# Patient Record
Sex: Female | Born: 2000 | Race: White | Hispanic: No | Marital: Single | Smoking: Never smoker
Health system: Southern US, Community
[De-identification: ages and names within clinical notes are randomized; demographics above are authoritative.]

## PROBLEM LIST (undated history)

## (undated) DIAGNOSIS — J189 Pneumonia, unspecified organism: Secondary | ICD-10-CM

## (undated) HISTORY — PX: NO PAST SURGERIES: SHX2092

---
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
## 2022-01-07 ENCOUNTER — Encounter (HOSPITAL_COMMUNITY): Payer: Self-pay | Admitting: Radiology

## 2022-02-11 ENCOUNTER — Telehealth: Payer: Self-pay | Admitting: Gastroenterology

## 2022-02-11 DIAGNOSIS — R1013 Epigastric pain: Secondary | ICD-10-CM

## 2022-02-11 NOTE — Telephone Encounter (Signed)
Patient left vm stating that she needs to make an appt with [place] for an ultrasound. States that she was seen 6 months ago and was told to schedule another ultrasound.  ?

## 2022-02-13 ENCOUNTER — Telehealth: Payer: Self-pay

## 2022-02-13 NOTE — Telephone Encounter (Signed)
This is a Dr Maximino Greenland pt seen 10/22 due for 6 month [place].... Please advise if okay to order under your name or would you like pt to have an OV with you first? ?

## 2022-02-13 NOTE — Telephone Encounter (Signed)
error 

## 2022-02-17 ENCOUNTER — Telehealth: Payer: Self-pay | Admitting: Gastroenterology

## 2022-02-17 NOTE — Telephone Encounter (Signed)
Left message on voicemail ? ?[place] scheduled at [facility] 4/17 arrive at 8:00am, NPO after 12 midnight ?

## 2022-02-17 NOTE — Telephone Encounter (Signed)
Patient left vm returning a call. Requesting a call back. ?

## 2022-02-17 NOTE — Addendum Note (Signed)
Addended by: Roena Malady on: 02/17/2022 09:53 AM ? ? Modules accepted: Orders ? ?

## 2022-02-19 NOTE — Telephone Encounter (Signed)
Pt is aware of US.

## 2022-02-24 ENCOUNTER — Ambulatory Visit
Admission: RE | Admit: 2022-02-24 | Discharge: 2022-02-24 | Disposition: A | Discharge status: Home / Self Care | Payer: Managed Care, Other (non HMO) | Source: Ambulatory Visit | Attending: Gastroenterology | Admitting: Gastroenterology

## 2022-02-24 DIAGNOSIS — R1013 Epigastric pain: Secondary | ICD-10-CM

## 2022-07-05 IMAGING — US US ABDOMEN LIMITED
1 series · 14 of 25 positions shown · non-contrast
Comparison: none available

CLINICAL DATA: Epigastric pain, liver lesion

EXAM:
ULTRASOUND ABDOMEN LIMITED RIGHT UPPER QUADRANT

[Series 1: us abdomen limited · 0.15mm/px · 61 acquisitions, 14 frames shown]
[im 1/61]
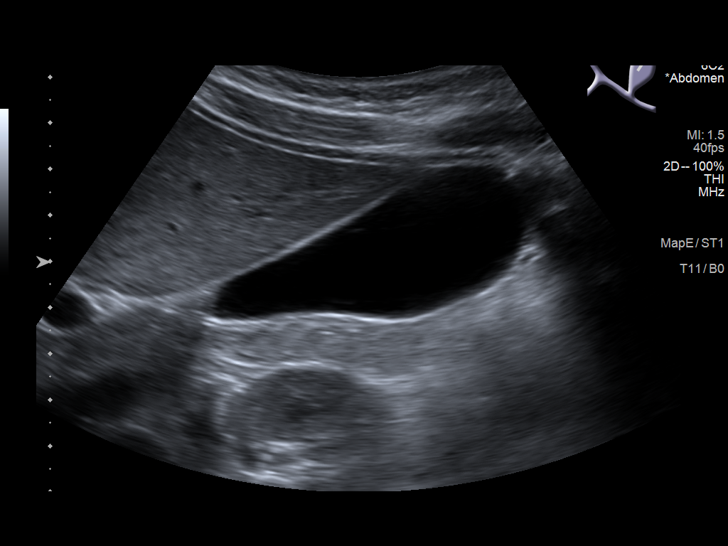
[im 6/61]
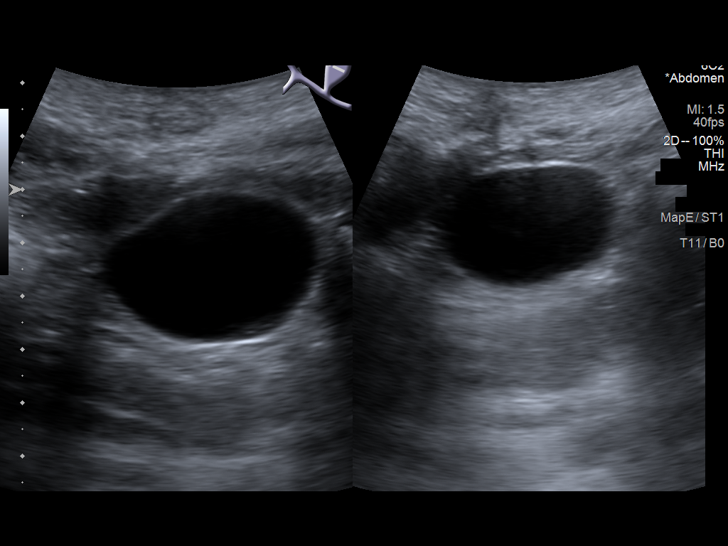
[im 11/61]
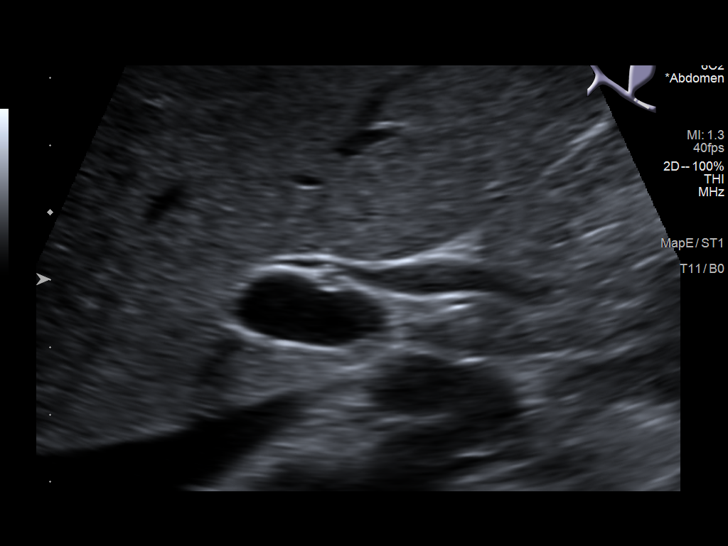
[im 16/61]
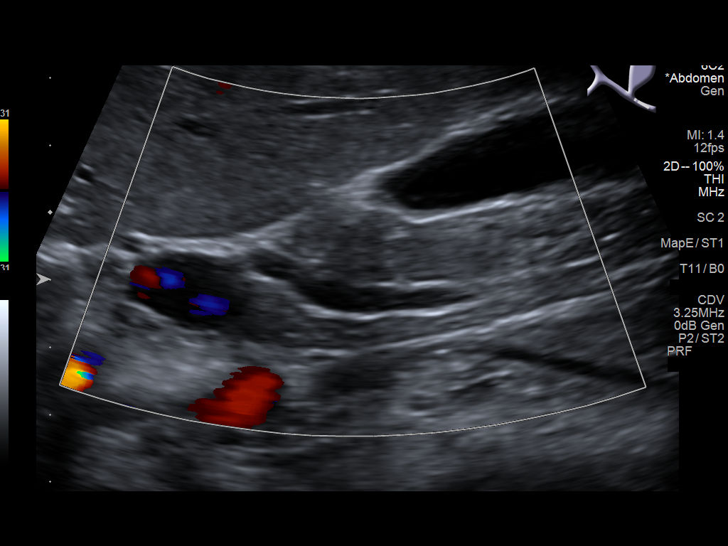
[im 21/61]
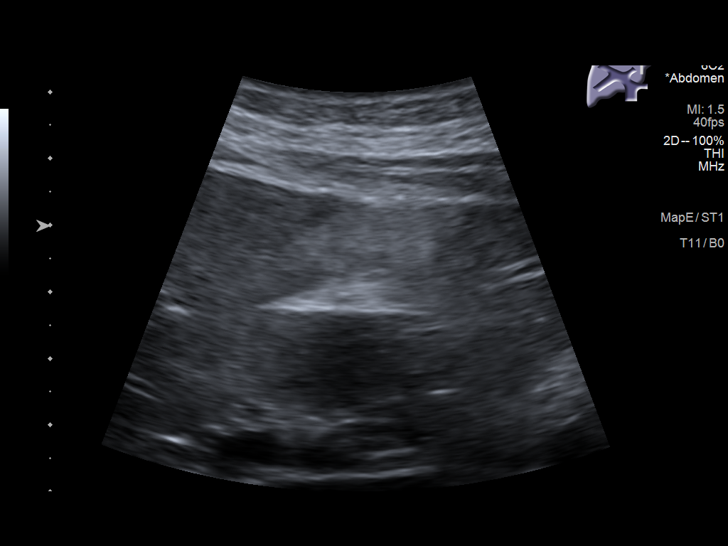
[im 23/61]
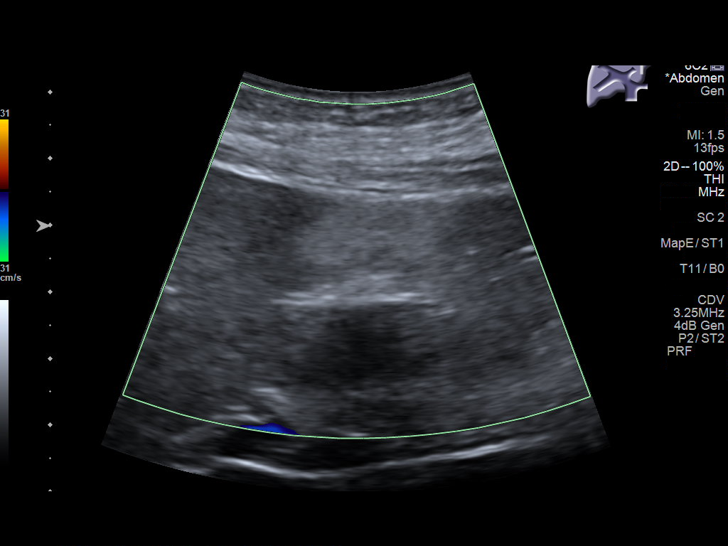
[im 28/61]
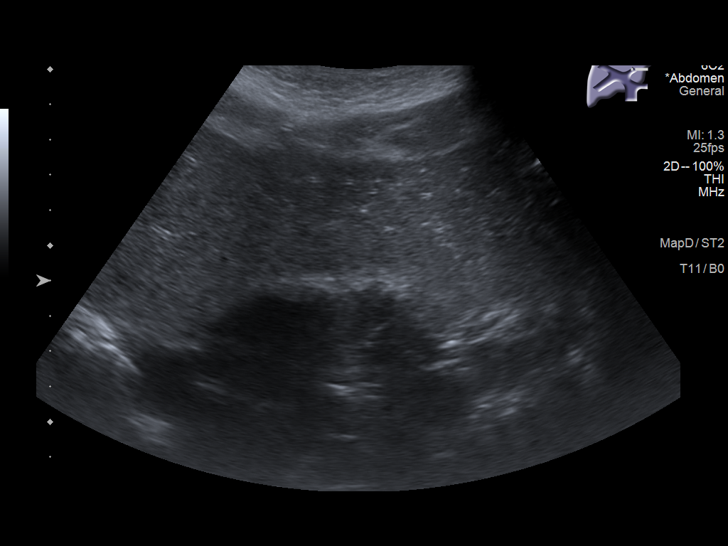
[im 33/61]
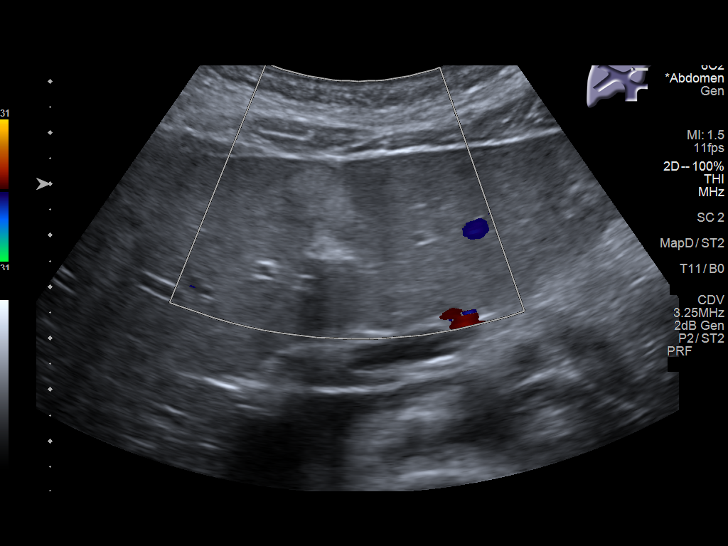
[im 38/61]
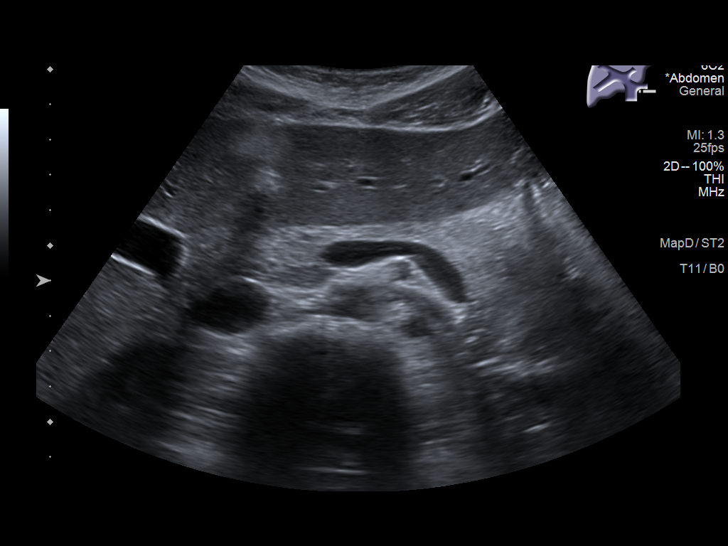
[im 41/61]
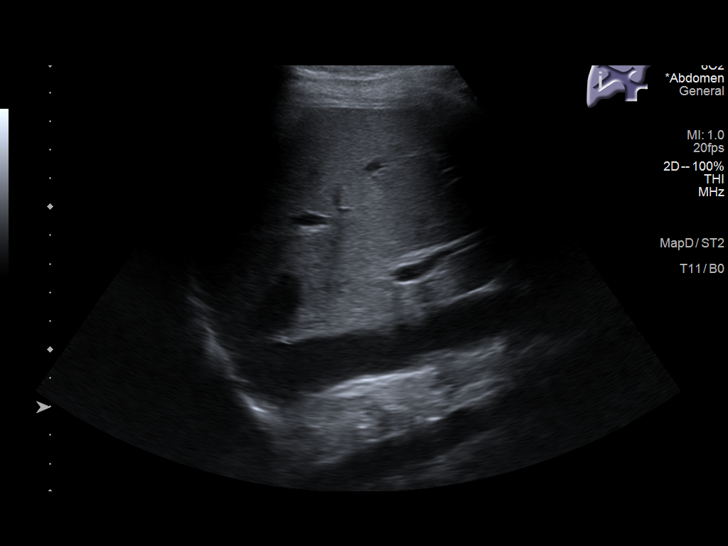
[im 46/61]
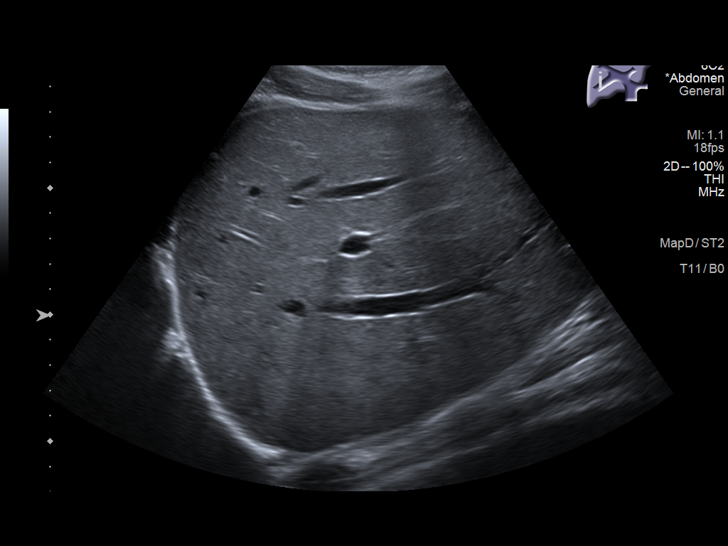
[im 51/61]
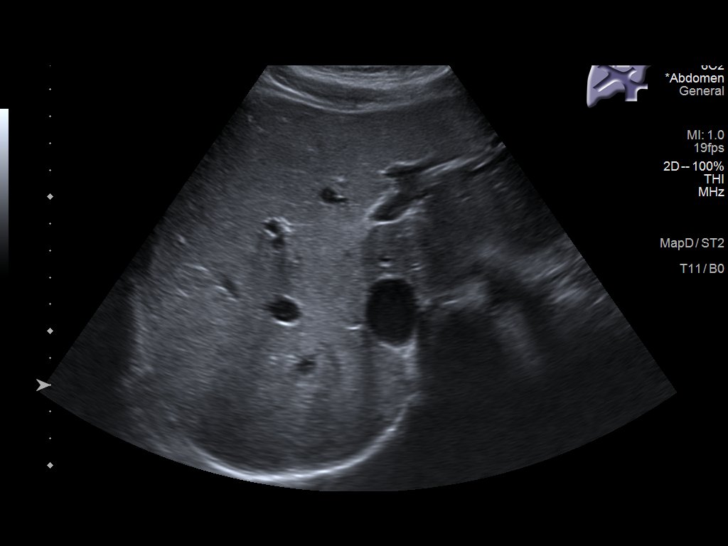
[im 56/61]
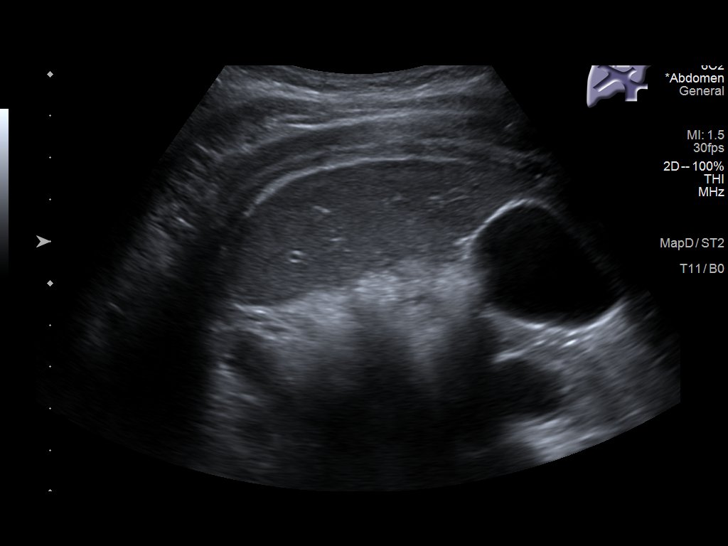
[im 61/61]
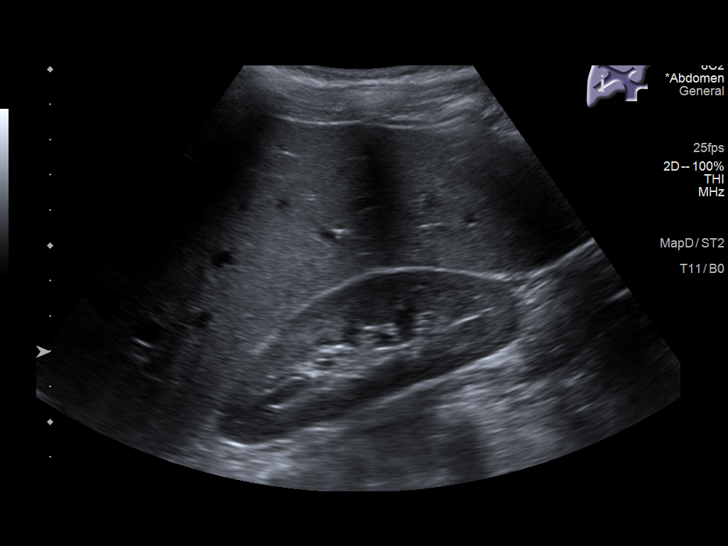

[14 of 25 positions shown; findings below may reference images not displayed]

FINDINGS: Gallbladder:

No gallstones or wall thickening visualized. No sonographic Murphy
sign noted by sonographer.

Common bile duct:

Diameter: 2 mm

Liver:

Normal background echogenicity. Within the anterior left hepatic
lobe, there is a well-circumscribed slightly lobulated hyperechoic
subcapsular lesion measuring 2.2 x 1.0 x 1.4 cm, favored to be a
hemangioma.

No other hepatic abnormality. No biliary dilatation or obstruction
pattern. Portal vein is patent on color Doppler imaging with normal
direction of blood flow towards the liver.

Other: No free fluid. Included views of the right kidney demonstrate
no hydronephrosis.
IMPRESSION: 2.2 cm anterior left hepatic subcapsular hyperechoic lesion
compatible with hemangioma. No available comparison studies to
document stability. Recommend follow-up abdominal ultrasound in 6
months.

No other acute finding by ultrasound.

## 2022-07-19 ENCOUNTER — Encounter: Payer: Self-pay | Admitting: Emergency Medicine

## 2022-07-19 ENCOUNTER — Other Ambulatory Visit: Payer: Self-pay

## 2022-07-19 ENCOUNTER — Emergency Department
Admission: EM | Admit: 2022-07-19 | Discharge: 2022-07-19 | Disposition: A | Discharge status: Home / Self Care | Payer: Managed Care, Other (non HMO) | Attending: Emergency Medicine | Admitting: Emergency Medicine

## 2022-07-19 ENCOUNTER — Emergency Department: Payer: Managed Care, Other (non HMO)

## 2022-07-19 DIAGNOSIS — A09 Infectious gastroenteritis and colitis, unspecified: Secondary | ICD-10-CM

## 2022-07-19 DIAGNOSIS — R197 Diarrhea, unspecified: Secondary | ICD-10-CM | POA: Diagnosis not present

## 2022-07-19 DIAGNOSIS — R112 Nausea with vomiting, unspecified: Secondary | ICD-10-CM | POA: Insufficient documentation

## 2022-07-19 LAB — URINE DRUG SCREEN, QUALITATIVE (ARMC ONLY)
Amphetamines, Ur Screen: NOT DETECTED
Barbiturates, Ur Screen: NOT DETECTED
Benzodiazepine, Ur Scrn: NOT DETECTED
Cannabinoid 50 Ng, Ur ~~LOC~~: POSITIVE — AB
Cocaine Metabolite,Ur ~~LOC~~: NOT DETECTED
MDMA (Ecstasy)Ur Screen: NOT DETECTED
Methadone Scn, Ur: NOT DETECTED
Opiate, Ur Screen: NOT DETECTED
Phencyclidine (PCP) Ur S: NOT DETECTED
Tricyclic, Ur Screen: NOT DETECTED

## 2022-07-19 LAB — URINALYSIS, ROUTINE W REFLEX MICROSCOPIC
Bilirubin Urine: NEGATIVE
Glucose, UA: NEGATIVE mg/dL
Hgb urine dipstick: NEGATIVE
Ketones, ur: 5 mg/dL — AB
Leukocytes,Ua: NEGATIVE
Nitrite: NEGATIVE
Protein, ur: 30 mg/dL — AB
Specific Gravity, Urine: 1.02 (ref 1.005–1.030)
pH: 6 (ref 5.0–8.0)

## 2022-07-19 LAB — COMPREHENSIVE METABOLIC PANEL
ALT: 42 U/L (ref 0–44)
AST: 54 U/L — ABNORMAL HIGH (ref 15–41)
Albumin: 4.8 g/dL (ref 3.5–5.0)
Alkaline Phosphatase: 66 U/L (ref 38–126)
Anion gap: 14 (ref 5–15)
BUN: 9 mg/dL (ref 6–20)
CO2: 20 mmol/L — ABNORMAL LOW (ref 22–32)
Calcium: 9.6 mg/dL (ref 8.9–10.3)
Chloride: 108 mmol/L (ref 98–111)
Creatinine, Ser: 0.82 mg/dL (ref 0.44–1.00)
GFR, Estimated: >60 mL/min (ref >60)
Glucose, Bld: 135 mg/dL — ABNORMAL HIGH (ref 70–99)
Potassium: 4.2 mmol/L (ref 3.5–5.1)
Sodium: 142 mmol/L (ref 135–145)
Total Bilirubin: 0.9 mg/dL (ref 0.3–1.2)
Total Protein: 8.1 g/dL (ref 6.5–8.1)

## 2022-07-19 LAB — CBC
HCT: 38.6 % (ref 36.0–46.0)
Hemoglobin: 12.9 g/dL (ref 12.0–15.0)
MCH: 30.6 pg (ref 26.0–34.0)
MCHC: 33.4 g/dL (ref 30.0–36.0)
MCV: 91.7 fL (ref 80.0–100.0)
Platelets: 251 10*3/uL (ref 150–400)
RBC: 4.21 MIL/uL (ref 3.87–5.11)
RDW: 12.1 % (ref 11.5–15.5)
WBC: 14.8 10*3/uL — ABNORMAL HIGH (ref 4.0–10.5)
nRBC: 0 % (ref 0.0–0.2)

## 2022-07-19 LAB — PREGNANCY, URINE: Preg Test, Ur: NEGATIVE

## 2022-07-19 LAB — CBG MONITORING, ED: Glucose-Capillary: 101 mg/dL — ABNORMAL HIGH (ref 70–99)

## 2022-07-19 LAB — LIPASE, BLOOD: Lipase: 24 U/L (ref 11–51)

## 2022-07-19 MED ORDER — LORAZEPAM 2 MG/ML IJ SOLN
1.0000 mg | Freq: Once | INTRAMUSCULAR | Status: AC
  Administered 2022-07-19: 1 mg via INTRAVENOUS
  Filled 2022-07-19: qty 1

## 2022-07-19 MED ORDER — ONDANSETRON 4 MG PO TBDP: 4.0000 mg | ORAL_TABLET | Freq: Four times a day (QID) | ORAL | 0 refills | Status: AC | PRN

## 2022-07-19 MED ORDER — IOHEXOL 300 MG/ML  SOLN
100.0000 mL | Freq: Once | INTRAMUSCULAR | Status: AC | PRN
  Administered 2022-07-19: 100 mL via INTRAVENOUS

## 2022-07-19 MED ORDER — AZITHROMYCIN 500 MG PO TABS: 500.0000 mg | ORAL_TABLET | Freq: Every day | ORAL | 0 refills | Status: AC

## 2022-07-19 MED ORDER — SODIUM CHLORIDE 0.9 % IV BOLUS
1000.0000 mL | Freq: Once | INTRAVENOUS | Status: AC
  Administered 2022-07-19: 1000 mL via INTRAVENOUS

## 2022-07-19 MED ORDER — ONDANSETRON HCL 4 MG/2ML IJ SOLN
4.0000 mg | INTRAMUSCULAR | Status: AC
  Administered 2022-07-19: 4 mg via INTRAVENOUS
  Filled 2022-07-19: qty 2

## 2022-07-19 MED ORDER — AZITHROMYCIN 500 MG PO TABS
500.0000 mg | ORAL_TABLET | Freq: Once | ORAL | Status: AC
  Administered 2022-07-19: 500 mg via ORAL
  Filled 2022-07-19: qty 1

## 2022-07-19 MED ORDER — ACETAMINOPHEN 500 MG PO TABS
1000.0000 mg | ORAL_TABLET | ORAL | Status: AC
  Administered 2022-07-19: 1000 mg via ORAL
  Filled 2022-07-19: qty 2

## 2022-07-19 NOTE — ED Triage Notes (Signed)
Pt reports for the past year she has had intermittent NVD. Pt states she woke up this am with another episode of it. Pt reports has been following a GI specialist but no one has been able to give her answers. Pt wants relief from sx's and to know the cause.

## 2022-07-19 NOTE — ED Notes (Signed)
Pt presents to ED with c/o of ABD pain, N/V/D for the past few years. Pt states she is here to be treated "and get answers". Pt does endorse she smoked marijuana daily and does not plan to stop to see if this helps her situation.

## 2022-07-19 NOTE — ED Notes (Signed)
Pt was drinking water and was educated to remain NPO due to N/V and needing a CT scan.   Pt also given paper scrub pants due to not making it to the restroom in time, belongings bag given to pt as well.

## 2022-07-19 NOTE — ED Provider Notes (Signed)
[facility] Provider Note    Event Date/Time   First MD Initiated Contact with Patient 07/19/22 [phone]     (approximate)   History   Emesis, Nausea, and Diarrhea   HPI  Hannah Parks is a 21 y.o. female who on review of gastroenterology notes from July of last year has a history of intermittent abdominal pain.  Notation from GI at that point advised symptoms at that point were felt likely functional.  Has trialed PPI in the past.  Had reassuring evaluation including inflammatory markers and Helicobacter testing as well as celiac serology in July  Patient reports last night she started developing upper abdominal pain nausea loose stools.  She reports she gets crampy pain followed by vomiting up yellow sort of frothy emesis and then will have very loose watery stools.  This happens about once every couple of months.  Seems to come out of the blue she does not know why.  She has been seen by specialist before had an ultrasound and blood work with no result.  Denies bloody stool.      Physical Exam   Triage Vital Signs: ED Triage Vitals  Enc Vitals Group     BP 07/19/22 0839 (!) 138/108     Pulse Rate 07/19/22 0839 100     Resp 07/19/22 0839 20     Temp 07/19/22 0839 (!) 97.5 F (36.4 C)     Temp Source 07/19/22 0839 Oral     SpO2 07/19/22 0839 96 %     Weight 07/19/22 0838 103 lb (46.7 kg)     Height 07/19/22 0838 5\' 3"  (1.6 m)     Head Circumference --      Peak Flow --      Pain Score 07/19/22 0838 8     Pain Loc --      Pain Edu? --      Excl. in GC? --     Most recent vital signs: Vitals:   07/19/22 1336 07/19/22 1444  BP: (!) 110/57 106/68  Pulse: 69 78  Resp: 14 15  Temp: (!) 100.9 F (38.3 C)   SpO2: 95% 100%     General: Awake, no distress.  She is however pacing about the room, somewhat elevated in her speech, and appears very anxious. CV:  Good peripheral perfusion.  No tachycardia.  Warm well-perfused normal  tones Resp:  Normal effort.  Clear bilateral. Abd:  No distention.  Abdomen soft, mild tenderness lower and left lower quadrant without rebound or guarding.  Examination had to be performed after patient was given initial treatment as she was initially pacing about an quite uncomfortable. Other:  Nontoxic in appearance.   ED Results / Procedures / Treatments   Labs (all labs ordered are listed, but only abnormal results are displayed) Labs Reviewed  COMPREHENSIVE METABOLIC PANEL - Abnormal; Notable for the following components:      Result Value   CO2 20 (*)    Glucose, Bld 135 (*)    AST 54 (*)    All other components within normal limits  CBC - Abnormal; Notable for the following components:   WBC 14.8 (*)    All other components within normal limits  URINALYSIS, ROUTINE W REFLEX MICROSCOPIC - Abnormal; Notable for the following components:   Color, Urine YELLOW (*)    APPearance HAZY (*)    Ketones, ur 5 (*)    Protein, ur 30 (*)    Bacteria, UA RARE (*)  All other components within normal limits  URINE DRUG SCREEN, QUALITATIVE ([facility] ONLY) - Abnormal; Notable for the following components:   Cannabinoid 50 Ng, Ur [place] POSITIVE (*)    All other components within normal limits  CBG MONITORING, ED - Abnormal; Notable for the following components:   Glucose-Capillary 101 (*)    All other components within normal limits  GASTROINTESTINAL PANEL BY PCR, STOOL (REPLACES STOOL CULTURE)  C DIFFICILE QUICK SCREEN W PCR REFLEX    URINE CULTURE  LIPASE, BLOOD  PREGNANCY, URINE  POC URINE PREG, ED    RADIOLOGY  CT ABDOMEN PELVIS W CONTRAST  Result Date: 07/19/2022 CLINICAL DATA:  Abdominal pain, acute, nonlocalized. EXAM: CT ABDOMEN AND PELVIS WITH CONTRAST TECHNIQUE: Multidetector CT imaging of the abdomen and pelvis was performed using the standard protocol following bolus administration of intravenous contrast. RADIATION DOSE REDUCTION: This exam was performed according to the  departmental dose-optimization program which includes automated exposure control, adjustment of the mA and/or kV according to patient size and/or use of iterative reconstruction technique. CONTRAST:  OMNIPAQUE IOHEXOL 300 MG/ML  SOLN COMPARISON:  None Available. FINDINGS: Lower chest: No acute abnormality. Hepatobiliary: No focal liver abnormality is seen. Gallbladder appears normal. No bile duct dilatation. Pancreas: Unremarkable. No pancreatic ductal dilatation or surrounding inflammatory changes. Spleen: Normal in size without focal abnormality. Adrenals/Urinary Tract: Adrenal glands appear normal. Kidneys are unremarkable without mass, stone or hydronephrosis. No perinephric fluid. No ureteral or bladder calculi are identified. Bladder appears normal. Stomach/Bowel: The majority of the walls of the large bowel appear thickened/edematous. Fluid is seen throughout a significant portion of the small bowel, with additional areas of small bowel wall thickening/inflammation. Appendix appears grossly normal. Stomach is unremarkable. Vascular/Lymphatic: No vascular abnormality seen. No enlarged lymph nodes are seen in the abdomen or pelvis. Reproductive: Uterus and bilateral adnexa are unremarkable. Other: No substantial free fluid. No abscess collection or free intraperitoneal air. Musculoskeletal: No osseous abnormality. IMPRESSION: 1. Findings are consistent with a diffuse enterocolitis of infectious or inflammatory nature. 2. Remainder of the abdomen and pelvis CT is unremarkable. No bowel obstruction. No evidence of acute solid organ abnormality. No renal or ureteral calculi. Appendix appears grossly normal. Electronically Signed   By: Bary Richard M.D.   On: 07/19/2022 11:35      PROCEDURES:  Critical Care performed: No  Procedures   MEDICATIONS ORDERED IN ED: Medications  LORazepam (ATIVAN) injection 1 mg (1 mg Intravenous Given 07/19/22 0954)  ondansetron (ZOFRAN) injection 4 mg (4 mg  Intravenous Given 07/19/22 0953)  sodium chloride 0.9 % bolus 1,000 mL (1,000 mLs Intravenous Bolus 07/19/22 0954)  iohexol (OMNIPAQUE) 300 MG/ML solution 100 mL (100 mLs Intravenous Contrast Given 07/19/22 1113)  azithromycin (ZITHROMAX) tablet 500 mg (500 mg Oral Given 07/19/22 1406)  acetaminophen (TYLENOL) tablet 1,000 mg (1,000 mg Oral Given 07/19/22 1443)     IMPRESSION / MDM / ASSESSMENT AND PLAN / ED COURSE  I reviewed the triage vital signs and the nursing notes.                              Differential diagnosis includes but is not limited to, abdominal perforation,cholecystitis, appendicitis, diverticulitis, colitis, esophagitis/gastritis, kidney stone, pyelonephritis, urinary tract infection, aortic aneurysm. All are considered in decision and treatment plan. Based upon the patient's presentation and risk factors, as well as the recurrent episodes of what she reports to be the same symptoms of the course of  a couple of years with thus far no obvious etiology as documented by gastroenterology and her presentation today I am inclined to obtain a CT scan to evaluate for possible acute structural abnormality.  She does have elevated white count, but her clinical history does not include obvious infectious cause other than vomiting and diarrhea.  Seems unlikely to represent acute cholecystitis or appendicitis based on clinical history and exam.  In addition the patient uses marijuana products frequently, and this certainly raises suspicion for possible cyclical vomiting syndrome.  At this point she appears more anxious than anything.  We will trial IV Ativan, anxiolysis, IV fluids and antiemetic obtain additional imaging studies.  Denies any lower abdominal pain denies any symptoms of be suggestive of acute gynecologic pathology.  Denies pregnancy.  Labs notable for leukocytosis.  AST slightly elevated at 54.  Urinalysis without evidence of infection, only occasional rare bacteria are seen.  Sent for  culture but denies acute urinary symptoms   Patient's presentation is most consistent with acute complicated illness / injury requiring diagnostic workup.  The patient is on the cardiac monitor to evaluate for evidence of arrhythmia and/or significant heart rate changes.    Clinical Course as of 07/19/22 1531  Sat Jul 19, 2022  1210 Patient resting calmly at this time.  No further nausea or vomiting.  She is slightly sedate, but appears much improved.  Resting comfortably in bed with normal hemodynamics. [MQ]  1210 CT interpreted by me for gross pathology, no obvious major pathology identified my interpretation.  Also reviewed radiologist report [MQ]    Clinical Course User Index [MQ] Sharyn Creamer, MD   ----------------------------------------- 2:03 PM on 07/19/2022 ----------------------------------------- Patient alert to voice, listens, but still fairly somnolent.  She is somnolent after receiving benzodiazepine injection, but understands diagnosis of presumed infectious diarrhea.  She does now have a fever as well.  She has had no further vomiting or diarrhea since arrival to the ER, and we have not been able to obtain a stool culture for this reason.  I will empirically treat her for presumed infectious diarrhea with azithromycin for 3-day course.  She is warm well-perfused, in no acute distress but is noted to be febrile.  She does not have any complicating factors suggestive of need for immediate hospitalization, but I think trial of outpatient treatment would be most reasonable.  Patient and her grandmother are both in agreement.  Plan to observe for slightly longer low as she is still somewhat sedate but steadily improving mental status after receiving Ativan.  Ongoing care assigned to Dr. Marisa Severin at 3:15 PM.  Currently observing for improvement in condition, patient steadily improving.  Was fairly somnolent after receiving Ativan earlier, but this notably also seem to improve her  symptoms. I have made referral to GI, discussed plan of care with her family including her grandmother as well as the patient.    Reassessment and final disposition anticipated based on further observation, likely discharging to home if patient continues to feel well and improved.   FINAL CLINICAL IMPRESSION(S) / ED DIAGNOSES   Final diagnoses:  Infectious diarrhea  Nausea and vomiting, unspecified vomiting type     Rx / DC Orders   ED Discharge Orders          Ordered    azithromycin (ZITHROMAX) 500 MG tablet  Daily        07/19/22 1401    Ambulatory referral to Gastroenterology       Comments: Presumed infectious enterocolitis, but  cyclical symptoms often as well. Consider inflammatory bowel disease, cyclical vomiting, etc.   07/19/22 1401    ondansetron (ZOFRAN-ODT) 4 MG disintegrating tablet  Every 6 hours PRN        07/19/22 1401             Note:  This document was prepared using Dragon voice recognition software and may include unintentional dictation errors.   Sharyn Creamer, MD 07/19/22 [phone]

## 2022-07-19 NOTE — ED Provider Notes (Signed)
-----------------------------------------   3:38 PM on 07/19/2022 -----------------------------------------  Patient is alert and oriented, appears comfortable, and is stable for discharge.  I counseled her on the CT findings and the plan of care.  Return precautions given, and she expresses understanding.   Dionne Bucy, MD 07/19/22 1539

## 2022-07-19 NOTE — Discharge Instructions (Signed)
Please follow-up closely with your primary care doctor, and also with gastroenterology for whom I have made a referral  Return to the ER right away if you develop weakness, severe vomiting, dehydration, severe or bloody stool, lightheadedness, severe abdominal pain or other new concerns or symptoms arise.

## 2022-07-20 LAB — URINE CULTURE: Culture: NO GROWTH

## 2022-08-12 ENCOUNTER — Other Ambulatory Visit: Payer: Self-pay | Admitting: Gastroenterology

## 2022-08-12 DIAGNOSIS — R1013 Epigastric pain: Secondary | ICD-10-CM

## 2022-08-22 ENCOUNTER — Ambulatory Visit
Admission: RE | Admit: 2022-08-22 | Discharge: 2022-08-22 | Disposition: A | Discharge status: Home / Self Care | Payer: Managed Care, Other (non HMO) | Source: Ambulatory Visit | Attending: Gastroenterology | Admitting: Gastroenterology

## 2022-08-22 DIAGNOSIS — R1013 Epigastric pain: Secondary | ICD-10-CM | POA: Insufficient documentation

## 2022-08-22 MED ORDER — TECHNETIUM TC 99M SULFUR COLLOID
2.1800 | Freq: Once | INTRAVENOUS | Status: AC | PRN
  Administered 2022-08-22: 2.18 via ORAL

## 2022-08-22 MED ORDER — TECHNETIUM TC 99M SULFUR COLLOID
2.1800 | Freq: Once | INTRAVENOUS | Status: DC | PRN
Start: 2022-08-22 — End: 2022-08-23

## 2023-03-07 IMAGING — US US ABDOMEN LIMITED
1 series · 15 of 25 positions shown · non-contrast
Comparison: 06/24/2021.

CLINICAL DATA: 21-year-old female with 2.2 cm left hepatic lesion
on ultrasound last year suspected to be benign hemangioma.

EXAM:
ULTRASOUND ABDOMEN LIMITED RIGHT UPPER QUADRANT

[Series 1: us abdomen limited ruq · 15 of 75 slices shown]
[im 1/75]
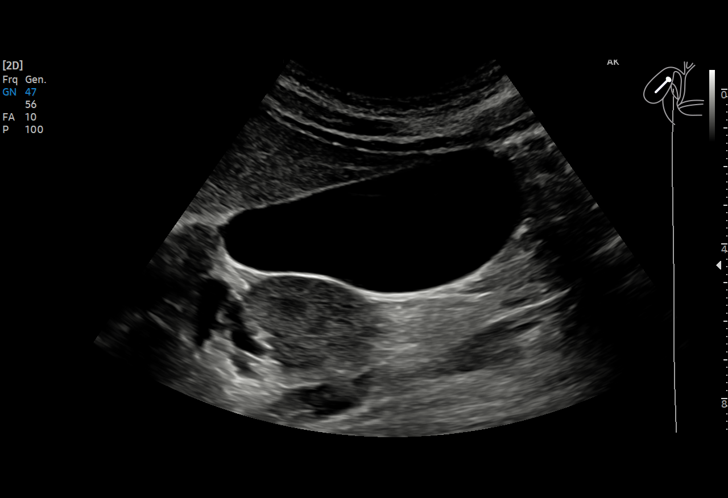
[im 7/75]
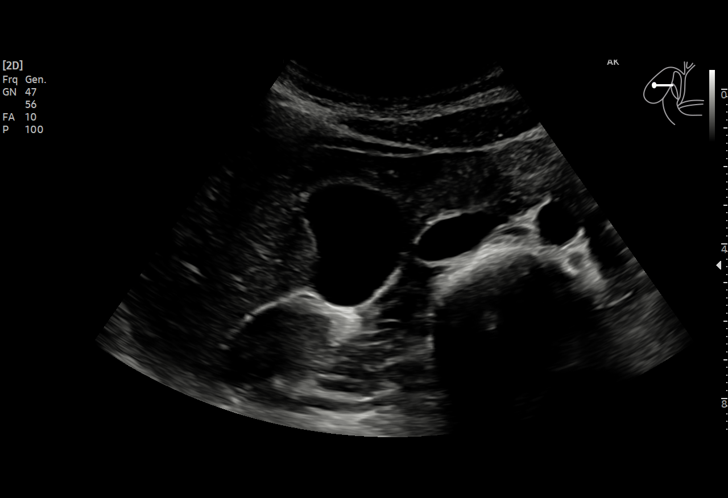
[im 13/75]
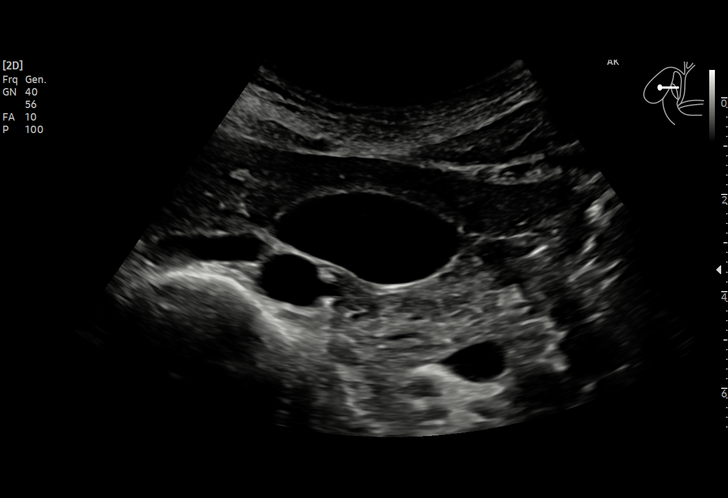
[im 16/75]
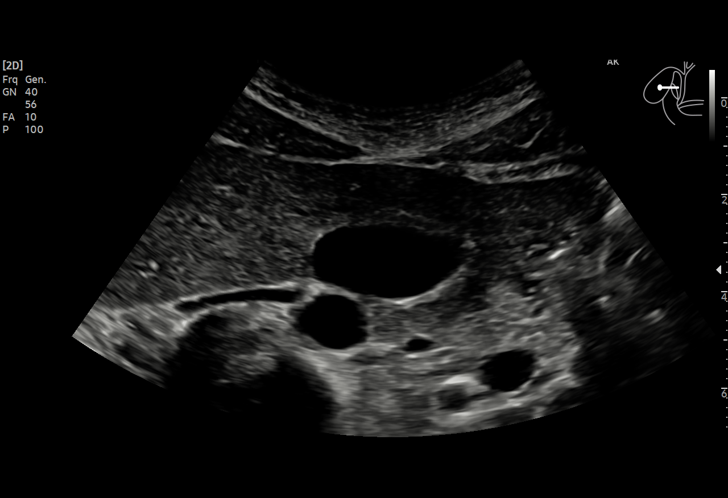
[im 22/75]
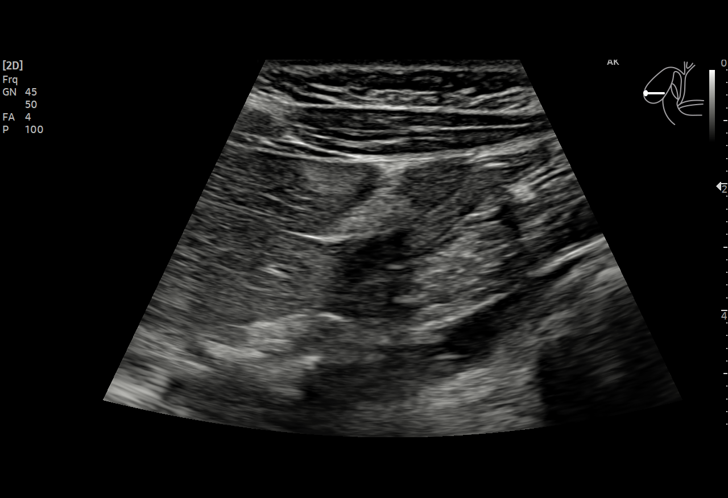
[im 28/75]
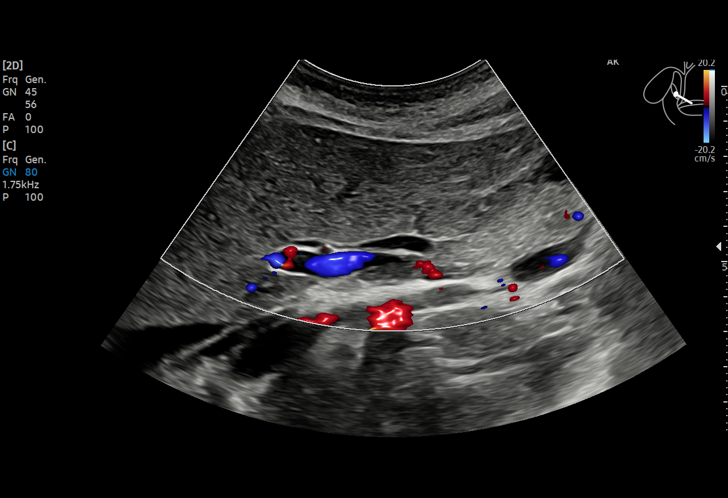
[im 31/75]
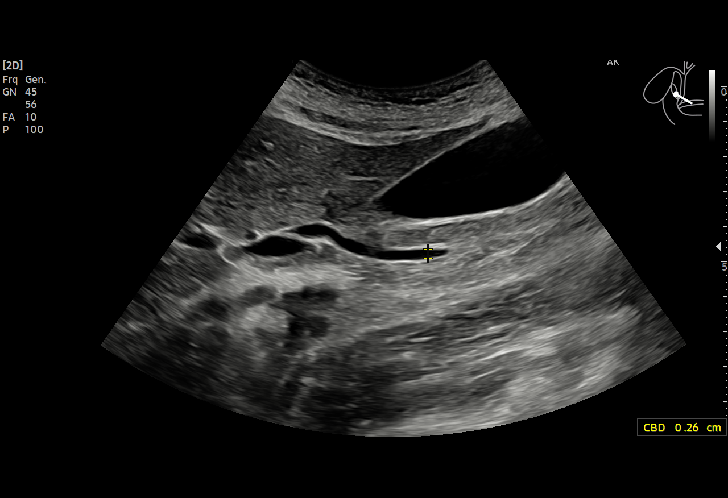
[im 38/75]
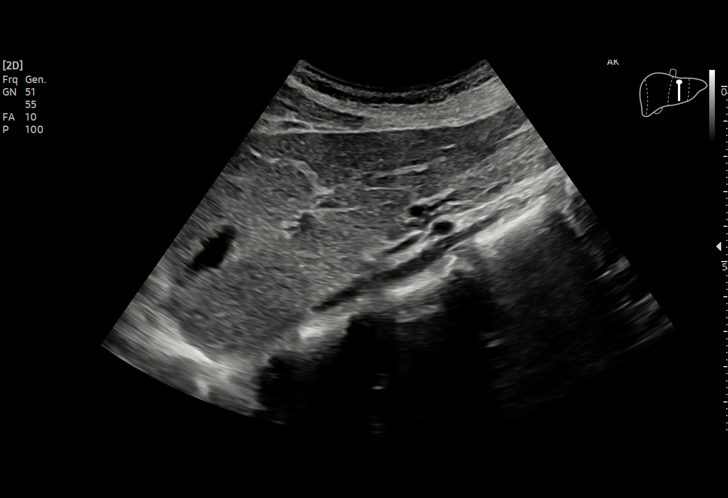
[im 44/75]
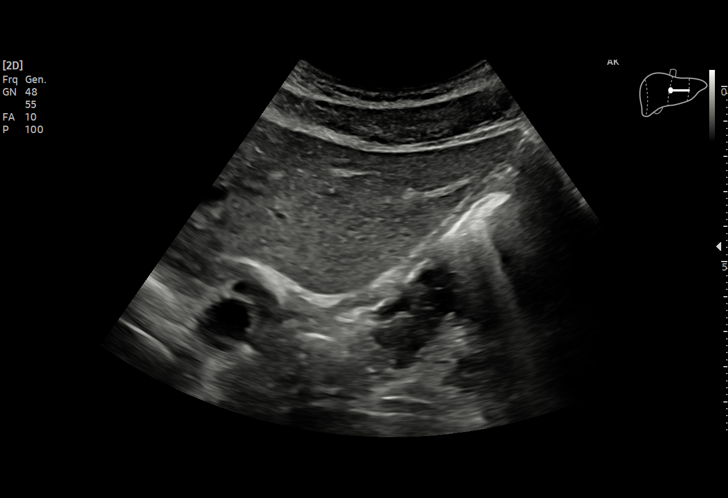
[im 47/75]
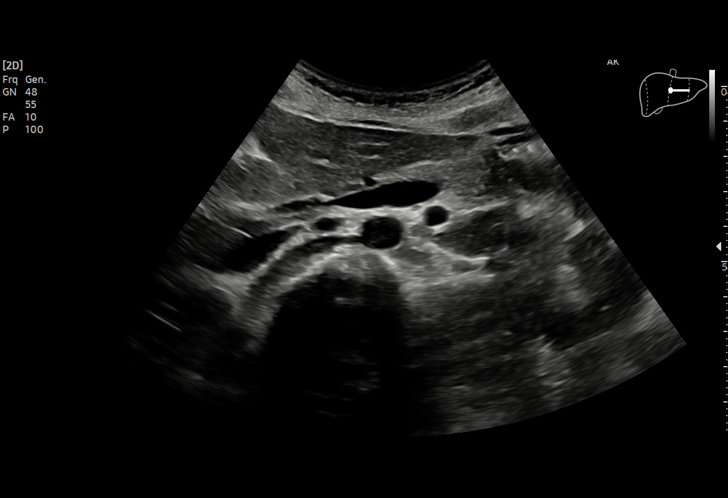
[im 53/75]
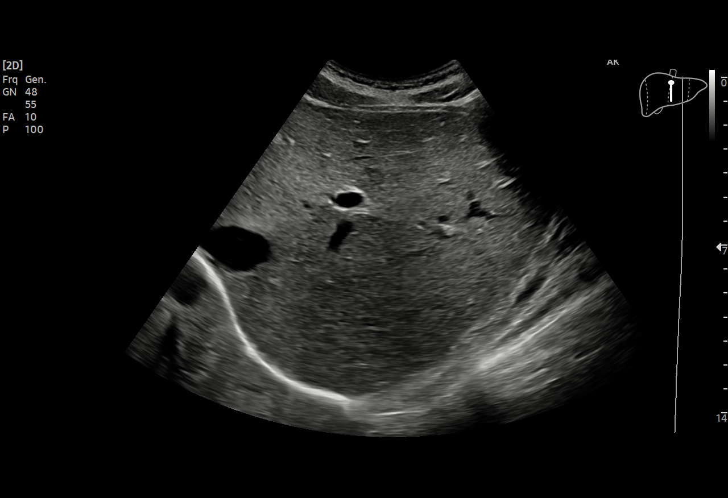
[im 59/75]
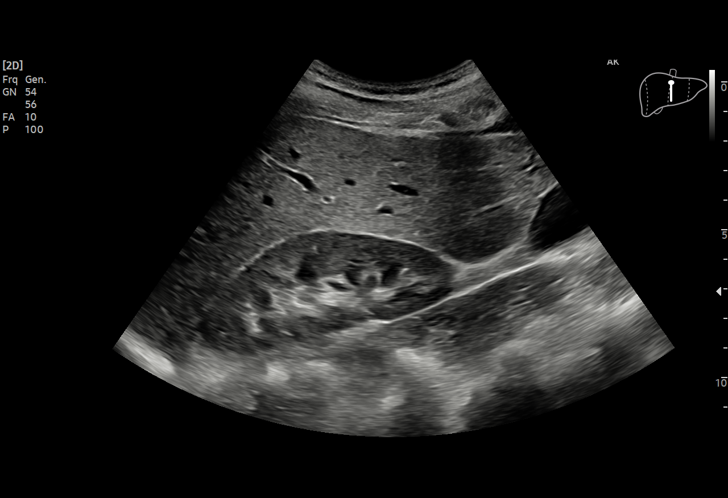
[im 62/75]
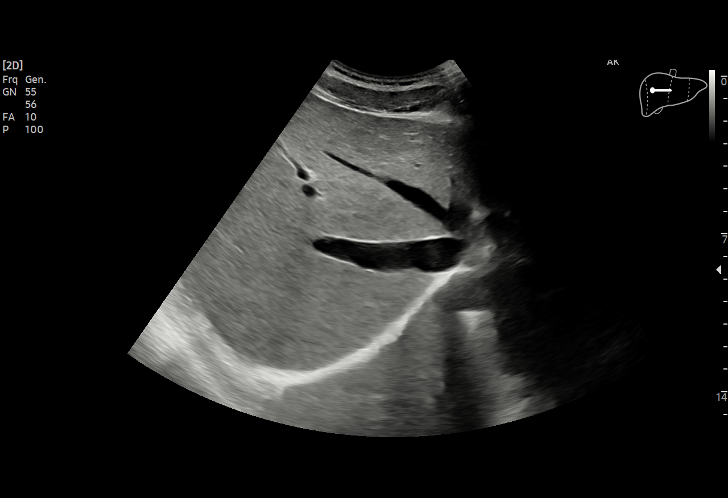
[im 68/75]
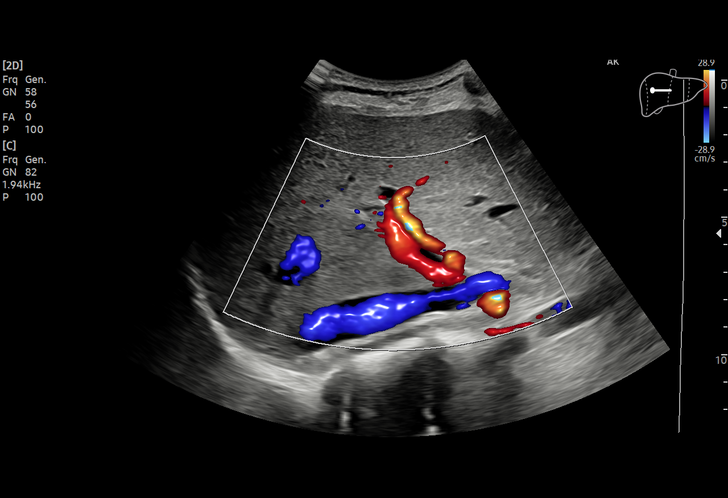
[im 75/75]
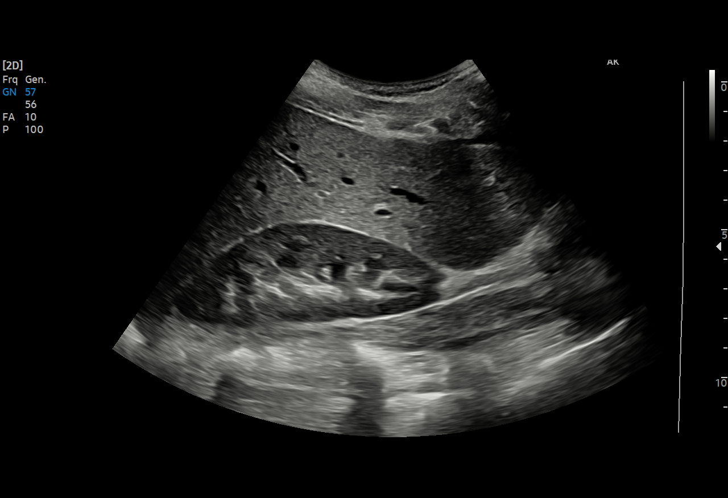

[15 of 25 positions shown; findings below may reference images not displayed]

FINDINGS: Gallbladder:

No gallstones or wall thickening visualized. No sonographic Murphy
sign noted by sonographer.

Common bile duct:

Diameter: 3 mm, normal.

Liver:

Redemonstrated small oval echogenic area in the left hepatic lobe
(image 24) with somewhat vague margins, but today up to roughly
cm (stable or slightly smaller since last year). No hypervascularity
on color Doppler (image 20). Background liver echogenicity within
normal limits. No other liver lesion. Portal vein is patent on color
Doppler imaging with normal direction of blood flow towards the
liver.

Other: Negative visible right kidney.
IMPRESSION: 1. Left hepatic lobe echogenic area appears stable or slightly
smaller since last year, compatible with benign hemangioma. If the
patient is asymptomatic then additional imaging follow-up of this
may not be valuable.
2. Otherwise normal right upper quadrant ultrasound.
